# Patient Record
Sex: Female | Born: 1995 | Race: Black or African American | Hispanic: No | Marital: Single | Smoking: Never smoker
Health system: Southern US, Community
[De-identification: ages and names within clinical notes are randomized; demographics above are authoritative.]

## PROBLEM LIST (undated history)

## (undated) DIAGNOSIS — L509 Urticaria, unspecified: Secondary | ICD-10-CM

## (undated) HISTORY — PX: NO PAST SURGERIES: SHX2092

## (undated) HISTORY — DX: Urticaria, unspecified: L50.9

---
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
## 2017-08-14 ENCOUNTER — Other Ambulatory Visit (HOSPITAL_COMMUNITY): Payer: Self-pay | Admitting: Family

## 2017-08-15 ENCOUNTER — Encounter: Payer: Self-pay | Admitting: Advanced Practice Midwife

## 2017-08-15 ENCOUNTER — Ambulatory Visit (INDEPENDENT_AMBULATORY_CARE_PROVIDER_SITE_OTHER): Payer: Managed Care, Other (non HMO) | Admitting: Advanced Practice Midwife

## 2017-08-15 VITALS — BP 136/75 | HR 79 | Wt 171.1 lb

## 2017-08-15 DIAGNOSIS — Z113 Encounter for screening for infections with a predominantly sexual mode of transmission: Secondary | ICD-10-CM

## 2017-08-15 DIAGNOSIS — Z3009 Encounter for other general counseling and advice on contraception: Secondary | ICD-10-CM

## 2017-08-15 DIAGNOSIS — Z01419 Encounter for gynecological examination (general) (routine) without abnormal findings: Secondary | ICD-10-CM | POA: Diagnosis not present

## 2017-08-15 MED ORDER — DESOGESTREL-ETHINYL ESTRADIOL 0.15-0.02/0.01 MG (21/5) PO TABS
1.0000 | ORAL_TABLET | Freq: Every day | ORAL | 11 refills | Status: DC
Start: 2017-08-15 — End: 2018-07-19

## 2017-08-15 NOTE — Progress Notes (Signed)
New pt to this office who desires continuation of OCP's - needs new Rx. .Marland Kitchen

## 2017-08-15 NOTE — Progress Notes (Signed)
Subjective:     Patient ID: Stacie Mccoy, female   DOB: 02/04/1996, 22 y.o.   MRN: [id number]  Stacie Mccoy is a 22 y.o. G0P0000 who presents today for OCP rx refill. She has been getting Kariva from her pediatrician, but is here to establish care with us. She has been on Kariva x 3 years without any problems. She would like to continue this birth control at this time.    Gynecologic Exam  The patient's pertinent negatives include no pelvic pain or vaginal bleeding. This is a new problem. The current episode started more than 1 year ago. The patient is experiencing no pain. She is not pregnant. The vaginal discharge was normal. There has been no bleeding. She is sexually active. She uses condoms for contraception. Her menstrual history has been regular (LMP 07/19/16 ).     Review of Systems  Genitourinary: Negative for pelvic pain.  All other systems reviewed and are negative.      Objective:   Physical Exam  Constitutional: She is oriented to person, place, and time. She appears well-developed and well-nourished. No distress.  HENT:  Head: Normocephalic.  Cardiovascular: Normal rate.  Pulmonary/Chest: Effort normal.  Abdominal: Soft. There is no tenderness.  Neurological: She is alert and oriented to person, place, and time.  Skin: Skin is warm and dry.  Psychiatric: She has a normal mood and affect.  Nursing note and vitals reviewed.      Assessment:     1. Encounter for well woman exam   2. Birth control counseling        Plan:     Pap next year at 4821 GC/CT from urine HIV/RPR today  RX kariva #1 with 11 RF

## 2017-08-15 NOTE — Patient Instructions (Signed)
Oral Contraception Information Oral contraceptive pills (OCPs) are medicines taken to prevent pregnancy. OCPs work by preventing the ovaries from releasing eggs. The hormones in OCPs also cause the cervical mucus to thicken, preventing the sperm from entering the uterus. The hormones also cause the uterine lining to become thin, not allowing a fertilized egg to attach to the inside of the uterus. OCPs are highly effective when taken exactly as prescribed. However, OCPs do not prevent sexually transmitted diseases (STDs). Safe sex practices, such as using condoms along with the pill, can help prevent STDs. Before taking the pill, you may have a physical exam and Pap test. Your health care provider may order blood tests. The health care provider will make sure you are a good candidate for oral contraception. Discuss with your health care provider the possible side effects of the OCP you may be prescribed. When starting an OCP, it can take 2 to 3 months for the body to adjust to the changes in hormone levels in your body. Types of oral contraception  The combination pill-This pill contains estrogen and progestin (synthetic progesterone) hormones. The combination pill comes in 21-day, 28-day, or 91-day packs. Some types of combination pills are meant to be taken continuously (365-day pills). With 21-day packs, you do not take pills for 7 days after the last pill. With 28-day packs, the pill is taken every day. The last 7 pills are without hormones. Certain types of pills have more than 21 hormone-containing pills. With 91-day packs, the first 84 pills contain both hormones, and the last 7 pills contain no hormones or contain estrogen only.  The minipill-This pill contains the progesterone hormone only. The pill is taken every day continuously. It is very important to take the pill at the same time each day. The minipill comes in packs of 28 pills. All 28 pills contain the hormone. Advantages of oral  contraceptive pills  Decreases premenstrual symptoms.  Treats menstrual period cramps.  Regulates the menstrual cycle.  Decreases a heavy menstrual flow.  May treatacne, depending on the type of pill.  Treats abnormal uterine bleeding.  Treats polycystic ovarian syndrome.  Treats endometriosis.  Can be used as emergency contraception. Things that can make oral contraceptive pills less effective OCPs can be less effective if:  You forget to take the pill at the same time every day.  You have a stomach or intestinal disease that lessens the absorption of the pill.  You take OCPs with other medicines that make OCPs less effective, such as antibiotics, certain HIV medicines, and some seizure medicines.  You take expired OCPs.  You forget to restart the pill on day 7, when using the packs of 21 pills.  Risks associated with oral contraceptive pills Oral contraceptive pills can sometimes cause side effects, such as:  Headache.  Nausea.  Breast tenderness.  Irregular bleeding or spotting.  Combination pills are also associated with a small increased risk of:  Blood clots.  Heart attack.  Stroke.  This information is not intended to replace advice given to you by your health care provider. Make sure you discuss any questions you have with your health care provider. Document Released: 08/07/2002 Document Revised: 10/23/2015 Document Reviewed: 11/05/2012 Elsevier Interactive Patient Education  2018 [employer]  

## 2017-08-16 LAB — HIV ANTIBODY (ROUTINE TESTING W REFLEX): HIV Screen 4th Generation wRfx: NONREACTIVE

## 2017-08-16 LAB — RPR: RPR Ser Ql: NONREACTIVE

## 2017-08-16 LAB — GC/CHLAMYDIA PROBE AMP (~~LOC~~) NOT AT ARMC
Chlamydia: NEGATIVE
Neisseria Gonorrhea: NEGATIVE

## 2018-01-02 ENCOUNTER — Encounter: Payer: Self-pay | Admitting: Family

## 2018-01-02 ENCOUNTER — Other Ambulatory Visit (INDEPENDENT_AMBULATORY_CARE_PROVIDER_SITE_OTHER): Payer: Managed Care, Other (non HMO)

## 2018-01-02 ENCOUNTER — Other Ambulatory Visit: Payer: Self-pay | Admitting: Family

## 2018-01-02 ENCOUNTER — Ambulatory Visit (INDEPENDENT_AMBULATORY_CARE_PROVIDER_SITE_OTHER): Payer: Managed Care, Other (non HMO) | Admitting: Family

## 2018-01-02 VITALS — BP 110/80 | HR 53 | Temp 98.7°F | Ht 60.0 in | Wt 174.1 lb

## 2018-01-02 DIAGNOSIS — R5383 Other fatigue: Secondary | ICD-10-CM

## 2018-01-02 DIAGNOSIS — L509 Urticaria, unspecified: Secondary | ICD-10-CM | POA: Diagnosis not present

## 2018-01-02 LAB — CBC WITH DIFFERENTIAL/PLATELET
BASOS PCT: 0.5 % (ref 0.0–3.0)
Basophils Absolute: 0 10*3/uL (ref 0.0–0.1)
Eosinophils Absolute: 0.2 10*3/uL (ref 0.0–0.7)
Eosinophils Relative: 2.6 % (ref 0.0–5.0)
HCT: 39.6 % (ref 36.0–46.0)
Hemoglobin: 13.1 g/dL (ref 12.0–15.0)
LYMPHS ABS: 2.5 10*3/uL (ref 0.7–4.0)
Lymphocytes Relative: 39.2 % (ref 12.0–46.0)
MCHC: 33.1 g/dL (ref 30.0–36.0)
MCV: 83.1 fl (ref 78.0–100.0)
MONOS PCT: 10.6 % (ref 3.0–12.0)
Monocytes Absolute: 0.7 10*3/uL (ref 0.1–1.0)
Neutro Abs: 3 10*3/uL (ref 1.4–7.7)
Neutrophils Relative %: 47.1 % (ref 43.0–77.0)
Platelets: 281 10*3/uL (ref 150.0–400.0)
RBC: 4.77 Mil/uL (ref 3.87–5.11)
RDW: 13.8 % (ref 11.5–15.5)
WBC: 6.3 10*3/uL (ref 4.0–10.5)

## 2018-01-02 LAB — COMPREHENSIVE METABOLIC PANEL
ALK PHOS: 46 U/L (ref 39–117)
ALT: 21 U/L (ref 0–35)
AST: 13 U/L (ref 0–37)
Albumin: 3.6 g/dL (ref 3.5–5.2)
BUN: 10 mg/dL (ref 6–23)
CHLORIDE: 107 meq/L (ref 96–112)
CO2: 25 mEq/L (ref 19–32)
Calcium: 9.2 mg/dL (ref 8.4–10.5)
Creatinine, Ser: 0.93 mg/dL (ref 0.40–1.20)
GFR: 97.64 mL/min (ref >60.00)
Glucose, Bld: 98 mg/dL (ref 70–99)
POTASSIUM: 5 meq/L (ref 3.5–5.1)
SODIUM: 140 meq/L (ref 135–145)
TOTAL PROTEIN: 6.8 g/dL (ref 6.0–8.3)
Total Bilirubin: 0.2 mg/dL (ref 0.2–1.2)

## 2018-01-02 LAB — VITAMIN D 25 HYDROXY (VIT D DEFICIENCY, FRACTURES): VITD: <7 ng/mL — ABNORMAL LOW (ref 30.00–100.00)

## 2018-01-02 LAB — TSH: TSH: 1.88 u[IU]/mL (ref 0.35–4.50)

## 2018-01-02 MED ORDER — TRIAMCINOLONE ACETONIDE 0.1 % EX CREA
1.0000 | TOPICAL_CREAM | Freq: Two times a day (BID) | CUTANEOUS | 0 refills | Status: DC
Start: 2018-01-02 — End: 2021-11-08

## 2018-01-02 MED ORDER — VITAMIN D (ERGOCALCIFEROL) 1.25 MG (50000 UNIT) PO CAPS: 50000.0000 [IU] | ORAL_CAPSULE | ORAL | 0 refills | Status: DC

## 2018-01-02 MED ORDER — CETIRIZINE HCL 10 MG PO TABS
10.0000 mg | ORAL_TABLET | Freq: Every day | ORAL | 3 refills | Status: DC
Start: 2018-01-02 — End: 2018-02-16

## 2018-01-02 NOTE — Patient Instructions (Signed)
Hives Hives (urticaria) are itchy, red, swollen areas on your skin. Hives can show up on any part of your body, and they can vary in size. They can be as small as the tip of a pen or much larger. Hives often fade within 24 hours (acute hives). In other cases, new hives show up after old ones fade. This can continue for many days or weeks (chronic hives). Hives are caused by your body's reaction to an irritant or to something that you are allergic to (trigger). You can get hives right after being around a trigger or hours later. Hives do not spread from person to person (are not contagious). Hives may get worse if you scratch them, if you exercise, or if you have worries (emotional stress). Follow these instructions at home: Medicines  Take or apply over-the-counter and prescription medicines only as told by your doctor.  If you were prescribed an antibiotic medicine, use it as told by your doctor. Do not stop taking the antibiotic even if you start to feel better. Skin Care  Apply cool, wet cloths (cool compresses) to the itchy, red, swollen areas.  Do not scratch your skin. Do not rub your skin. General instructions  Do not take hot showers or baths. This can make itching worse.  Do not wear tight clothes.  Use sunscreen and wear clothing that covers your skin when you are outside.  Avoid any triggers that cause your hives. Keep a journal to help you keep track of what causes your hives. Write down: ? What medicines you take. ? What you eat and drink. ? What products you use on your skin.  Keep all follow-up visits as told by your doctor. This is important. Contact a doctor if:  Your symptoms are not better with medicine.  Your joints are painful or swollen. Get help right away if:  You have a fever.  You have belly pain.  Your tongue or lips are swollen.  Your eyelids are swollen.  Your chest or throat feels tight.  You have trouble breathing or swallowing. These  symptoms may be an emergency. Do not wait to see if the symptoms will go away. Get medical help right away. Call your local emergency services (911 in the [place] Do not drive yourself to the hospital. This information is not intended to replace advice given to you by your health care provider. Make sure you discuss any questions you have with your health care provider. Document Released: 02/24/2008 Document Revised: 10/23/2015 Document Reviewed: 03/05/2015 Elsevier Interactive Patient Education  2018 [employer]  

## 2018-01-02 NOTE — Progress Notes (Signed)
Stacie Mccoy is a 22 y.o. female with the following history as recorded in EpicCare:  There are no active problems to display for this patient.   Current Outpatient Medications  Medication Sig Dispense Refill  . desogestrel-ethinyl estradiol (KARIVA) 0.15-0.02/0.01 MG (21/5) tablet Take 1 tablet by mouth daily. 1 Package 11  . cetirizine (ZYRTEC) 10 MG tablet Take 1 tablet (10 mg total) by mouth daily. 30 tablet 3  . triamcinolone cream (KENALOG) 0.1 % Apply 1 application topically 2 (two) times daily. 30 g 0   No current facility-administered medications for this visit.     Allergies: Patient has no known allergies.  History reviewed. No pertinent past medical history.  History reviewed. No pertinent surgical history.  Family History  Problem Relation Age of Onset  . Gout Father   . Hypertension Mother   . Asthma Brother     Social History   Tobacco Use  . Smoking status: Never Smoker  . Smokeless tobacco: Never Used  Substance Use Topics  . Alcohol use: No    Subjective:  Patient presents today as a new patient; history of increased itching/ "hives" x 3 months; notes that anywhere she scratches her skin, she breaks out in hives; denies any new soaps, foods, detergents or medications. No prior history of eczema/ asthma; not currently taking any type of antihistamine; not able to determine any specific trigger.  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    Objective:  Vitals:   01/02/18 0853  BP: 110/80  Pulse: (!) 53  Temp: 98.7 F (37.1 C)  TempSrc: Oral  SpO2: 99%  Weight: 174 lb 1.9 oz (79 kg)  Height: 5' (1.524 m)    General: Well developed, well nourished, in no acute distress  Skin : Warm and dry.  Head: Normocephalic and atraumatic  Lungs: Respirations unlabored; clear to auscultation bilaterally without wheeze, rales, rhonchi  CVS exam:  normal rate and regular rhythm.  Musculoskeletal: No deformities; no active joint inflammation  Extremities: No edema, cyanosis, clubbing  Vessels: Symmetric bilaterally  Neurologic: Alert and oriented; speech intact; face symmetrical; moves all extremities well; CNII-XII intact without focal deficit   Assessment:  1. Other fatigue   2. Urticaria     Plan:  1. Will update labs today; 2. ? Source; patient in agreement that she would like to see an allergist- referral updated; ? Food allergy; in the interim, trial of Zyrtec 10 mg daily; follow-up as needed after seeing allergist.   No follow-ups on file.  Orders Placed This Encounter  Procedures  . CBC w/Diff    Standing Status:   Future    Number of Occurrences:   1    Standing Expiration Date:   01/02/2019  . Comp Met (CMET)    Standing Status:   Future    Number of Occurrences:   1    Standing Expiration Date:   01/02/2019  . TSH    Standing Status:   Future    Number of Occurrences:   1    Standing Expiration Date:   01/02/2019  . Vitamin D (25 hydroxy)    Standing Status:   Future    Number of Occurrences:   1    Standing Expiration Date:   01/02/2019  . Ambulatory referral to Allergy    Referral Priority:   Routine    Referral Type:  Allergy Testing    Referral Reason:   Specialty Services Required    Requested Specialty:   Allergy    Number of Visits Requested:   1    Requested Prescriptions   Signed Prescriptions Disp Refills  . cetirizine (ZYRTEC) 10 MG tablet 30 tablet 3    Sig: Take 1 tablet (10 mg total) by mouth daily.  Marland Kitchen triamcinolone cream (KENALOG) 0.1 % 30 g 0    Sig: Apply 1 application topically 2 (two) times daily.

## 2018-02-16 ENCOUNTER — Encounter: Payer: Self-pay | Admitting: Allergy & Immunology

## 2018-02-16 ENCOUNTER — Ambulatory Visit (INDEPENDENT_AMBULATORY_CARE_PROVIDER_SITE_OTHER): Payer: Managed Care, Other (non HMO) | Admitting: Allergy & Immunology

## 2018-02-16 VITALS — BP 104/72 | HR 84 | Temp 99.4°F | Resp 18 | Ht 60.0 in | Wt 173.0 lb

## 2018-02-16 DIAGNOSIS — L508 Other urticaria: Secondary | ICD-10-CM | POA: Diagnosis not present

## 2018-02-16 MED ORDER — CETIRIZINE HCL 10 MG PO TABS: ORAL_TABLET | ORAL | 3 refills | Status: DC

## 2018-02-16 NOTE — Progress Notes (Signed)
NEW PATIENT  Date of Service/Encounter:  02/16/18  Referring provider: Marrian Salvage, FNP   Assessment:   Chronic urticaria - with sensitizations to to grasses, outdoor molds, dust mites and cat  Plan/Recommendations:   1. Chronic urticaria - Your history does not have any "red flags" such as fevers, joint pains, or permanent skin changes that would be concerning for a more serious cause of hives.  - Testing today showed: grasses, outdoor molds, dust mites and cat - Avoidance measures provided. - We will get some labs to rule out serious causes of hives: complete blood count, tryptase level, CMP, ESR, and CRP. - Chronic hives are often times a self limited process and will "burn themselves out" over 6-12 months, although this is not always the case.  - In the meantime, start suppressive dosing of antihistamines:   - Morning: Zyrtec (cetirizine) 10-22m (one to two tablets)  - Evening: Zyrtec (cetirizine) 10-235m(one to two tablets) - You can change this dosing at home, decreasing the dose as needed or increasing the dosing as needed.  - If you are not tolerating the medications or are tired of taking them every day, we can start treatment with a monthly injectable medication called Xolair.   2. Return in about 3 months (around 05/18/2018).  Subjective:   Stacie Mccoy is a [age].o. female presenting today for evaluation of  Chief Complaint  Patient presents with  . Urticaria    ?pressure induced? states that she breaks out whenever she scratches her skin. started about a year ago. worsened over the summer (May)    [place] a history of the following: There are no active problems to display for this patient.   History obtained from: chart review and patient.  [place] referred by MuMarrian SalvageFNEdmundson    Stacie Mccoy is a [age].o. female presenting for an evaluation of itchiness and hives. She reports that this has been going on for about 1  year but has become worse in April/May. She will become itchy in various parts of her body, legs, arms, back, feet, between fingers, etc, and when she scratches the area she will develop a rash. She describes the rash as raised, red areas that start off as separate areas and will come together and form a larger rash. She reports they will last about 30 minutes to an hour. She has tried hydrocortisone cream and benadryl. She reports that the cream did not help but that the benadryl helped with the itchiness a Demuro bit. She denies any other symptoms that occur with it, including SOB, wheezing, coughing, throat tightness, sneezing, runny nose, or other symptoms. She is not sure of any triggers. She denies any new hobbies, change in diet, change in medications, change in laundry detergent or soap. She denies and smoking, EtOH or drug use. 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 She lives in a dorm room and has not had any changes in her housing situation. She is a [employer] [place] school [place] in May 2020.   She denies any seasonal allergies. She has never had allergy testing done before. She denies any history of childhood asthma or skin disorders.  Otherwise, there is no history of other atopic diseases, including asthma, food allergies, drug allergies, environmental allergies, stinging insect allergies or  eczema. There is no significant infectious history. Vaccinations are up to date.    Past Medical History: There are no active problems to display for this patient.   Medication List:  Allergies as of 02/16/2018   No Known Allergies     Medication List        Accurate as of 02/16/18  3:48 PM. Always use your most recent med list.          cetirizine 10 MG tablet Commonly known as:  ZYRTEC Take one to two tablets twice a day as needed.   desogestrel-ethinyl estradiol  0.15-0.02/0.01 MG (21/5) tablet Commonly known as:  KARIVA,AZURETTE,MIRCETTE Take 1 tablet by mouth daily.   triamcinolone cream 0.1 % Commonly known as:  KENALOG Apply 1 application topically 2 (two) times daily.       Birth History: born at term without complications  Developmental History: [place] has met all milestones on time. She has required no speech therapy, occupational therapy and physical therapy.   Past Surgical History: Past Surgical History:  Procedure Laterality Date  . NO PAST SURGERIES       Family History: Family History  Problem Relation Age of Onset  . Gout Father   . Hypertension Mother   . Asthma Brother    Brother has asthma, and allergies to multiple things. Denies any family history of rheumatoid disorders including Lupus, RA, or Sjorgens syndrome.   Social History: [name] lives at home with her parents during the summer and a dorm during the school year. There is wood flooring throughout the home. They have gas heating and central cooling. There is a dog in the home. There are no dust mite coverings on the bedding. There is no tobacco exposure. 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      Review of Systems: a 14-point review of systems is pertinent for what is mentioned in HPI.  Otherwise, all other systems were negative. Constitutional: negative other than that listed in the HPI Eyes: negative other than that listed in the HPI Ears, nose, mouth, throat, and face: negative other than that listed in the HPI Respiratory: negative other than that listed in the HPI Cardiovascular: negative other than that listed in the HPI Gastrointestinal: negative other than that listed in the HPI Genitourinary: negative other than that listed in the HPI Integument: negative other than that listed in the HPI Hematologic: negative other than that listed in the HPI Musculoskeletal: negative other than that listed  in the HPI Neurological: negative other than that listed in the HPI Allergy/Immunologic: negative other than that listed in the HPI    Objective:   Blood pressure 104/72, pulse 84, temperature 99.4 F (37.4 C), temperature source Oral, resp. rate 18, height 5' (1.524 m), weight 173 lb (78.5 kg), SpO2 99 %. Body mass index is 33.79 kg/m.   Physical Exam:  General: Alert, interactive, in no acute distress. Pleasant talkative female.  Eyes: No conjunctival injection bilaterally, no discharge on the right, no discharge on the left and no Horner-Trantas dots present. PERRL bilaterally. EOMI without pain. No photophobia.  Ears: Right TM pearly gray with normal light reflex, Left TM pearly gray with normal light reflex, Right TM intact without perforation and Left TM intact without perforation.  Nose/Throat: External nose within normal limits and septum midline. Turbinates edematous and pale with clear discharge. Posterior oropharynx mildly erythematous without cobblestoning in the posterior oropharynx. Tonsils 2+ without  exudates.  Tongue without thrush. Neck: Supple without thyromegaly. Trachea midline. Adenopathy: no enlarged lymph nodes appreciated in the anterior cervical, occipital, axillary, epitrochlear, inguinal, or popliteal regions. Lungs: Clear to auscultation without wheezing, rhonchi or rales. No increased work of breathing. CV: Normal S1/S2. No murmurs. Capillary refill <2 seconds.  Abdomen: Nondistended, nontender. No guarding or rebound tenderness. Bowel sounds present in all fields and hyperactive  Skin: Warm and dry, without lesions or rashes. Extremities:  No clubbing, cyanosis or edema. Neuro:   Grossly intact. No focal deficits appreciated. Responsive to questions.  Diagnostic studies:   Allergy Studies:   Indoor/Outdoor Percutaneous Adult Environmental Panel: positive to timothy grass, Drechslera, Df mite, Dp mites and cat. Otherwise negative with adequate  controls.     Salvatore Marvel, MD [facility]

## 2018-02-16 NOTE — Patient Instructions (Addendum)
1. Chronic urticaria - Your history does not have any "red flags" such as fevers, joint pains, or permanent skin changes that would be concerning for a more serious cause of hives.  - Testing today showed: grasses, outdoor molds, dust mites and cat - Avoidance measures provided. - We will get some labs to rule out serious causes of hives: complete blood count, tryptase level, CMP, ESR, and CRP. - Chronic hives are often times a self limited process and will "burn themselves out" over 6-12 months, although this is not always the case.  - In the meantime, start suppressive dosing of antihistamines:   - Morning: Zyrtec (cetirizine) 10-98m (one to two tablets)  - Evening: Zyrtec (cetirizine) 10-252m(one to two tablets) - You can change this dosing at home, decreasing the dose as needed or increasing the dosing as needed.  - If you are not tolerating the medications or are tired of taking them every day, we can start treatment with a monthly injectable medication called Xolair.   2. Return in about 3 months (around 05/18/2018).   Please inform usKoreaf any Emergency Department visits, hospitalizations, or changes in symptoms. Call usKoreaefore going to the ED for breathing or allergy symptoms since we might be able to fit you in for a sick visit. Feel free to contact usKoreanytime with any questions, problems, or concerns.  It was a pleasure to meet you and your family today!  Websites that have reliable patient information: 1. American Academy of Asthma, Allergy, and Immunology: www.aaaai.org 2. Food Allergy Research and Education (FARE): foodallergy.org 3. Mothers of Asthmatics: http://www.asthmacommunitynetwork.org 4. American College of Allergy, Asthma, and Immunology: wwMonthlyElectricBill.co.uk Make sure you are registered to vote! If you have moved or changed any of your contact information, you will need to get this updated before voting!    Reducing Pollen Exposure  The American Academy of Allergy,  Asthma and Immunology suggests the following steps to reduce your exposure to pollen during allergy seasons.    1. Do not hang sheets or clothing out to dry; pollen may collect on these items. 2. Do not mow lawns or spend time around freshly cut grass; mowing stirs up pollen. 3. Keep windows closed at night.  Keep car windows closed while driving. 4. Minimize morning activities outdoors, a time when pollen counts are usually at their highest. 5. Stay indoors as much as possible when pollen counts or humidity is high and on windy days when pollen tends to remain in the air longer. 6. Use air conditioning when possible.  Many air conditioners have filters that trap the pollen spores. 7. Use a HEPA room air filter to remove pollen form the indoor air you breathe.  Control of Mold Allergen   Mold and fungi can grow on a variety of surfaces provided certain temperature and moisture conditions exist.  Outdoor molds grow on plants, decaying vegetation and soil.  The major outdoor mold, Alternaria and Cladosporium, are found in very high numbers during hot and dry conditions.  Generally, a late Summer - Fall peak is seen for common outdoor fungal spores.  Rain will temporarily lower outdoor mold spore count, but counts rise rapidly when the rainy period ends.  The most important indoor molds are Aspergillus and Penicillium.  Dark, humid and poorly ventilated basements are ideal sites for mold growth.  The next most common sites of mold growth are the bathroom and the kitchen.  Outdoor (Seasonal) Mold Control  Positive outdoor molds via  skin testing: Drechslera (Curvalaria)  1. Use air conditioning and keep windows closed 2. Avoid exposure to decaying vegetation. 3. Avoid leaf raking. 4. Avoid grain handling. 5. Consider wearing a face mask if working in moldy areas.     Control of House Dust Mite Allergen    House dust mites play a major role in allergic asthma and rhinitis.  They occur in  environments with high humidity wherever human skin, the food for dust mites is found. High levels have been detected in dust obtained from mattresses, pillows, carpets, upholstered furniture, bed covers, clothes and soft toys.  The principal allergen of the house dust mite is found in its feces.  A gram of dust may contain 1,000 mites and 250,000 fecal particles.  Mite antigen is easily measured in the air during house cleaning activities.    1. Encase mattresses, including the box spring, and pillow, in an air tight cover.  Seal the zipper end of the encased mattresses with wide adhesive tape. 2. Wash the bedding in water of 130 degrees Farenheit weekly.  Avoid cotton comforters/quilts and flannel bedding: the most ideal bed covering is the dacron comforter. 3. Remove all upholstered furniture from the bedroom. 4. Remove carpets, carpet padding, rugs, and non-washable window drapes from the bedroom.  Wash drapes weekly or use plastic window coverings. 5. Remove all non-washable stuffed toys from the bedroom.  Wash stuffed toys weekly. 6. Have the room cleaned frequently with a vacuum cleaner and a damp dust-mop.  The patient should not be in a room which is being cleaned and should wait 1 hour after cleaning before going into the room. 7. Close and seal all heating outlets in the bedroom.  Otherwise, the room will become filled with dust-laden air.  An electric heater can be used to heat the room. 8. Reduce indoor humidity to less than 50%.  Do not use a humidifier.  Control of Dog or Cat Allergen  Avoidance is the best way to manage a dog or cat allergy. If you have a dog or cat and are allergic to dog or cats, consider removing the dog or cat from the home. If you have a dog or cat but don't want to find it a new home, or if your family wants a pet even though someone in the household is allergic, here are some strategies that may help keep symptoms at bay:  1. Keep the pet out of your bedroom  and restrict it to only a few rooms. Be advised that keeping the dog or cat in only one room will not limit the allergens to that room. 2. Don't pet, hug or kiss the dog or cat; if you do, wash your hands with soap and water. 3. High-efficiency particulate air (HEPA) cleaners run continuously in a bedroom or living room can reduce allergen levels over time. 4. Regular use of a high-efficiency vacuum cleaner or a central vacuum can reduce allergen levels. 5. Giving your dog or cat a bath at least once a week can reduce airborne allergen.

## 2018-02-21 LAB — TRYPTASE: Tryptase: 5.7 ug/L (ref 2.2–13.2)

## 2018-02-21 LAB — CBC WITH DIFFERENTIAL/PLATELET
Basophils Absolute: 0 10*3/uL (ref 0.0–0.2)
Basos: 1 %
EOS (ABSOLUTE): 0.1 10*3/uL (ref 0.0–0.4)
Eos: 1 %
Hematocrit: 40.2 % (ref 34.0–46.6)
Hemoglobin: 13.2 g/dL (ref 11.1–15.9)
Immature Grans (Abs): 0 10*3/uL (ref 0.0–0.1)
Immature Granulocytes: 0 %
LYMPHS ABS: 1.8 10*3/uL (ref 0.7–3.1)
Lymphs: 28 %
MCH: 27.3 pg (ref 26.6–33.0)
MCHC: 32.8 g/dL (ref 31.5–35.7)
MCV: 83 fL (ref 79–97)
Monocytes Absolute: 0.7 10*3/uL (ref 0.1–0.9)
Monocytes: 10 %
NEUTROS ABS: 3.8 10*3/uL (ref 1.4–7.0)
Neutrophils: 60 %
Platelets: 290 10*3/uL (ref 150–450)
RBC: 4.84 x10E6/uL (ref 3.77–5.28)
RDW: 12.3 % (ref 12.3–15.4)
WBC: 6.4 10*3/uL (ref 3.4–10.8)

## 2018-02-21 LAB — CMP14+EGFR
A/G RATIO: 1.3 (ref 1.2–2.2)
ALK PHOS: 54 IU/L (ref 39–117)
ALT: 21 IU/L (ref 0–32)
AST: 16 IU/L (ref 0–40)
Albumin: 3.9 g/dL (ref 3.5–5.5)
BILIRUBIN TOTAL: 0.2 mg/dL (ref 0.0–1.2)
BUN/Creatinine Ratio: 14 (ref 9–23)
BUN: 11 mg/dL (ref 6–20)
CO2: 21 mmol/L (ref 20–29)
Calcium: 9.1 mg/dL (ref 8.7–10.2)
Chloride: 102 mmol/L (ref 96–106)
Creatinine, Ser: 0.76 mg/dL (ref 0.57–1.00)
GFR calc Af Amer: 130 mL/min/{1.73_m2} (ref >59)
GFR calc non Af Amer: 112 mL/min/{1.73_m2} (ref >59)
GLUCOSE: 67 mg/dL (ref 65–99)
Globulin, Total: 3 g/dL (ref 1.5–4.5)
POTASSIUM: 4 mmol/L (ref 3.5–5.2)
Sodium: 139 mmol/L (ref 134–144)
Total Protein: 6.9 g/dL (ref 6.0–8.5)

## 2018-02-21 LAB — SEDIMENTATION RATE: Sed Rate: 22 mm/hr (ref 0–32)

## 2018-02-21 LAB — C-REACTIVE PROTEIN: CRP: 6 mg/L (ref 0–10)

## 2018-02-21 LAB — ALPHA-GAL PANEL
Alpha Gal IgE*: <0.1 kU/L (ref <0.10)
BEEF CLASS INTERPRETATION: 0
Class Interpretation: 0
Lamb/Mutton (Ovis spp) IgE: <0.1 kU/L (ref <0.35)
PORK CLASS INTERPRETATION: 0

## 2018-04-07 ENCOUNTER — Ambulatory Visit: Payer: Managed Care, Other (non HMO) | Admitting: Family

## 2018-04-07 DIAGNOSIS — Z0289 Encounter for other administrative examinations: Secondary | ICD-10-CM

## 2018-05-18 ENCOUNTER — Ambulatory Visit: Payer: Managed Care, Other (non HMO) | Admitting: Allergy

## 2018-05-18 DIAGNOSIS — J309 Allergic rhinitis, unspecified: Secondary | ICD-10-CM

## 2018-07-17 ENCOUNTER — Other Ambulatory Visit: Payer: Self-pay | Admitting: Advanced Practice Midwife

## 2019-08-01 ENCOUNTER — Other Ambulatory Visit: Payer: Self-pay | Admitting: Advanced Practice Midwife

## 2019-08-02 ENCOUNTER — Telehealth: Payer: Self-pay | Admitting: *Deleted

## 2019-08-02 MED ORDER — DESOGESTREL-ETHINYL ESTRADIOL 0.15-0.02/0.01 MG (21/5) PO TABS: 1.0000 | ORAL_TABLET | Freq: Every day | ORAL | 0 refills | Status: DC

## 2019-08-02 NOTE — Telephone Encounter (Addendum)
VM message left by pt's mother requesting refill of Stacie Mccoy's birth control. She has picked up her last remaining refill. Please call back.   3/4  1400 Called pt and informed her that we can provide 1 refill only of her OCP's (good for 3 months). She has not been seen in office for 2 years and now requires Annual Gyn exam. Pt advised to call back to schedule office appt within the next 2 months. She will be given additional refills at the time of her visit. Pt voiced understanding, agreed to plan of care and had no further questions.

## 2019-09-26 ENCOUNTER — Encounter: Payer: Self-pay | Admitting: Advanced Practice Midwife

## 2019-09-26 ENCOUNTER — Other Ambulatory Visit: Payer: Self-pay

## 2019-09-26 ENCOUNTER — Ambulatory Visit (INDEPENDENT_AMBULATORY_CARE_PROVIDER_SITE_OTHER): Payer: BLUE CROSS/BLUE SHIELD | Admitting: Advanced Practice Midwife

## 2019-09-26 ENCOUNTER — Other Ambulatory Visit (HOSPITAL_COMMUNITY)
Admission: RE | Admit: 2019-09-26 | Discharge: 2019-09-26 | Disposition: A | Discharge status: Home / Self Care | Payer: BLUE CROSS/BLUE SHIELD | Source: Ambulatory Visit | Attending: Advanced Practice Midwife | Admitting: Advanced Practice Midwife

## 2019-09-26 VITALS — BP 131/77 | HR 75 | Wt 171.0 lb

## 2019-09-26 DIAGNOSIS — Z01419 Encounter for gynecological examination (general) (routine) without abnormal findings: Secondary | ICD-10-CM | POA: Insufficient documentation

## 2019-09-26 MED ORDER — DESOGESTREL-ETHINYL ESTRADIOL 0.15-0.02/0.01 MG (21/5) PO TABS: 1.0000 | ORAL_TABLET | Freq: Every day | ORAL | 4 refills | Status: AC

## 2019-09-26 NOTE — Progress Notes (Signed)
GYNECOLOGY ANNUAL PREVENTATIVE CARE ENCOUNTER NOTE  Subjective:   Stacie Mccoy is a 24 y.o. G0P0000 female here for a routine annual gynecologic exam.  Current complaints: none.   Denies abnormal vaginal bleeding, discharge, pelvic pain, problems with intercourse or other gynecologic concerns.    Gynecologic History LMP 08/28/2019 approx.  Contraception: OCP (estrogen/progesterone) Last Pap: none. Results were: NA Last mammogram: Na, age . Results were: NA  Obstetric History OB History  Gravida Para Term Preterm AB Living  0 0 0 0 0 0  SAB TAB Ectopic Multiple Live Births  0 0 0 0 0    Past Medical History:  Diagnosis Date  . Urticaria     Past Surgical History:  Procedure Laterality Date  . NO PAST SURGERIES      Current Outpatient Medications on File Prior to Visit  Medication Sig Dispense Refill  . desogestrel-ethinyl estradiol (KARIVA) 0.15-0.02/0.01 MG (21/5) tablet Take 1 tablet by mouth daily. 84 tablet 0  . cetirizine (ZYRTEC) 10 MG tablet Take one to two tablets twice a day as needed. (Patient not taking: Reported on 09/26/2019) 120 tablet 3  . triamcinolone cream (KENALOG) 0.1 % Apply 1 application topically 2 (two) times daily. (Patient not taking: Reported on 09/26/2019) 30 g 0   No current facility-administered medications on file prior to visit.    No Known Allergies  Social History   Socioeconomic History  . Marital status: Single    Spouse name: Not on file  . Number of children: Not on file  . Years of education: Not on file  . Highest education level: Not on file  Occupational History  . Not on file  Tobacco Use  . Smoking status: Never Smoker  . Smokeless tobacco: Never Used  Substance and Sexual Activity  . Alcohol use: No  . Drug use: No  . Sexual activity: Yes    Birth control/protection: OCP  Other Topics Concern  . Not on file  Social History Narrative  . Not on file   Social Determinants of Health   Financial Resource Strain:    . Difficulty of Paying Living Expenses:   Food Insecurity:   . Worried About Charity fundraiser in the Last Year:   . Arboriculturist in the Last Year:   Transportation Needs:   . Film/video editor (Medical):   Marland Kitchen Lack of Transportation (Non-Medical):   Physical Activity:   . Days of Exercise per Week:   . Minutes of Exercise per Session:   Stress:   . Feeling of Stress :   Social Connections:   . Frequency of Communication with Friends and Family:   . Frequency of Social Gatherings with Friends and Family:   . Attends Religious Services:   . Active Member of Clubs or Organizations:   . Attends Archivist Meetings:   Marland Kitchen Marital Status:   Intimate Partner Violence:   . Fear of Current or Ex-Partner:   . Emotionally Abused:   Marland Kitchen Physically Abused:   . Sexually Abused:     Family History  Problem Relation Age of Onset  . Gout Father   . Hypertension Mother   . Asthma Brother     The following portions of the patient's history were reviewed and updated as appropriate: allergies, current medications, past family history, past medical history, past social history, past surgical history and problem list.  Review of Systems Pertinent items noted in HPI and remainder of comprehensive ROS otherwise negative.  Objective:  BP 131/77   Pulse 75   Wt 171 lb (77.6 kg)   BMI 33.40 kg/m  CONSTITUTIONAL: Well-developed, well-nourished female in no acute distress.  HENT:  Normocephalic, atraumatic, External right and left ear normal. Oropharynx is clear and moist EYES: Conjunctivae and EOM are normal. Pupils are equal, round, and reactive to light. No scleral icterus.  NECK: Normal range of motion, supple, no masses.  Normal thyroid.  SKIN: Skin is warm and dry. No rash noted. Not diaphoretic. No erythema. No pallor. NEUROLOGIC: Alert and oriented to person, place, and time. Normal reflexes, muscle tone coordination. No cranial nerve deficit noted. PSYCHIATRIC: Normal  mood and affect. Normal behavior. Normal judgment and thought content. CARDIOVASCULAR: Normal heart rate noted, regular rhythm RESPIRATORY: Clear to auscultation bilaterally. Effort and breath sounds normal, no problems with respiration noted. BREASTS: Symmetric in size. No masses, skin changes, nipple drainage, or lymphadenopathy. ABDOMEN: Soft, normal bowel sounds, no distention noted.  No tenderness, rebound or guarding.  PELVIC: Normal appearing external genitalia; normal appearing vaginal mucosa and cervix.  No abnormal discharge noted.  Pap smear obtained.  Normal uterine size, no other palpable masses, no uterine or adnexal tenderness. MUSCULOSKELETAL: Normal range of motion. No tenderness.  No cyanosis, clubbing, or edema.  2+ distal pulses.   Assessment and Plan:  1. Women's annual routine gynecological examination - Cytology - PAP( [facility]) - RX Kariva #3 packs with 3 refills   Will follow up results of pap smear and manage accordingly. Routine preventative health maintenance measures emphasized. Please refer to After Visit Summary for other counseling recommendations.    Thressa Sheller DNP, CNM  09/26/19  1:40 PM

## 2019-09-27 LAB — CYTOLOGY - PAP
Chlamydia: POSITIVE — AB
Comment: NEGATIVE
Comment: NEGATIVE
Comment: NORMAL
Diagnosis: NEGATIVE
High risk HPV: NEGATIVE
Neisseria Gonorrhea: NEGATIVE

## 2019-10-04 ENCOUNTER — Telehealth: Payer: Self-pay | Admitting: Lactation Services

## 2019-10-04 DIAGNOSIS — A749 Chlamydial infection, unspecified: Secondary | ICD-10-CM

## 2019-10-04 MED ORDER — DOXYCYCLINE HYCLATE 50 MG PO CAPS: 100.0000 mg | ORAL_CAPSULE | Freq: Two times a day (BID) | ORAL | 0 refills | Status: AC

## 2019-10-04 NOTE — Telephone Encounter (Signed)
-----   Message from Armando Reichert, CNM sent at 10/04/2019 12:51 PM EDT ----- +chlamydia. Please call patient and treat.

## 2019-10-04 NOTE — Telephone Encounter (Signed)
Patient called and informed that she has positive for Chlamydia.   She was informed that Doxycycline to be sent to Pharmacy and she should take entire dosage. Informed her to have partner call PCP or [facility] for treatment and abstain from sexual intercourse for 14 days after both partners are treated.   Patient informed that she needs to have follow up appt in the office in 3 weeeks for TOC. Patient voiced understanding to all the above.   STD report form faxed to [facility].

## 2019-10-25 ENCOUNTER — Ambulatory Visit: Payer: Medicaid Other

## 2020-04-08 ENCOUNTER — Encounter: Payer: Self-pay | Admitting: *Deleted

## 2020-10-12 ENCOUNTER — Other Ambulatory Visit: Payer: Self-pay | Admitting: Advanced Practice Midwife

## 2021-11-08 ENCOUNTER — Ambulatory Visit
Admission: EM | Admit: 2021-11-08 | Discharge: 2021-11-08 | Disposition: A | Discharge status: Home / Self Care | Payer: Managed Care, Other (non HMO) | Attending: Emergency Medicine | Admitting: Emergency Medicine

## 2021-11-08 DIAGNOSIS — J039 Acute tonsillitis, unspecified: Secondary | ICD-10-CM | POA: Insufficient documentation

## 2021-11-08 DIAGNOSIS — J358 Other chronic diseases of tonsils and adenoids: Secondary | ICD-10-CM

## 2021-11-08 DIAGNOSIS — J36 Peritonsillar abscess: Secondary | ICD-10-CM | POA: Diagnosis present

## 2021-11-08 LAB — POCT RAPID STREP A (OFFICE): Rapid Strep A Screen: NEGATIVE

## 2021-11-08 MED ORDER — CEFDINIR 300 MG PO CAPS: 300.0000 mg | ORAL_CAPSULE | Freq: Two times a day (BID) | ORAL | 0 refills | Status: AC

## 2021-11-08 NOTE — ED Provider Notes (Signed)
[facility]    CSN: [id number] Arrival date & time: 11/08/21  1046    HISTORY   Chief Complaint  Patient presents with   Sore Throat   HPI Stacie Mccoy is a 26 y.o. female. Patient presents to urgent care complaining of a 3-day history of sore throat.  Patient states initially she did not think much about it, thought she might of just caught a virus.  Patient states this morning, her throat felt significantly worse so she decided to look at her tonsils which she noticed were significantly enlarged with white patches all over them.  Patient states the left tonsil is greater than the right and she also has noticed increased swelling on her upper neck below her jaw on her left side as well.  Patient denies fever, headache, nausea, vomiting, diarrhea, known sick contacts, exposure to STD.  Patient reports significant pain with swallowing but denies cough and hoarseness.  The history is provided by the patient.   Past Medical History:  Diagnosis Date   Urticaria    There are no problems to display for this patient.  Past Surgical History:  Procedure Laterality Date   NO PAST SURGERIES     OB History     Gravida  0   Para  0   Term  0   Preterm  0   AB  0   Living  0      SAB  0   IAB  0   Ectopic  0   Multiple  0   Live Births  0          Home Medications    Prior to Admission medications   Medication Sig Start Date End Date Taking? Authorizing Provider  cefdinir (OMNICEF) 300 MG capsule Take 1 capsule (300 mg total) by mouth 2 (two) times daily for 10 days. 11/08/21 11/18/21 Yes Theadora Rama Scales, PA-C  desogestrel-ethinyl estradiol (KARIVA) 0.15-0.02/0.01 MG (21/5) tablet Take 1 tablet by mouth daily. 09/26/19   Armando Reichert, CNM   Family History Family History  Problem Relation Age of Onset   Gout Father    Hypertension Mother    Asthma Brother    Social History Social History   Tobacco Use   Smoking status: Never    Smokeless tobacco: Never  Vaping Use   Vaping Use: Never used  Substance Use Topics   Alcohol use: No   Drug use: No   Allergies   Patient has no known allergies.  Review of Systems Review of Systems Pertinent findings noted in history of present illness.   Physical Exam Triage Vital Signs ED Triage Vitals  Enc Vitals Group     BP 03/27/21 0827 (!) 147/82     Pulse Rate 03/27/21 0827 72     Resp 03/27/21 0827 18     Temp 03/27/21 0827 98.3 F (36.8 C)     Temp Source 03/27/21 0827 Oral     SpO2 03/27/21 0827 98 %     Weight --      Height --      Head Circumference --      Peak Flow --      Pain Score 03/27/21 0826 5     Pain Loc --      Pain Edu? --      Excl. in GC? --   No data found.  Updated Vital Signs BP 112/60 (BP Location: Left Arm)   Pulse (!) 106  Temp 98.3 F (36.8 C) (Oral)   Resp 16   SpO2 98%   Physical Exam Vitals and nursing note reviewed.  Constitutional:      General: She is not in acute distress.    Appearance: She is well-developed. She is ill-appearing. She is not toxic-appearing.  HENT:     Head: Normocephalic and atraumatic.     Salivary Glands: Right salivary gland is diffusely enlarged. Right salivary gland is not tender. Left salivary gland is diffusely enlarged and tender.     Right Ear: Hearing and external ear normal.     Left Ear: Hearing and external ear normal.     Ears:     Comments: Bilateral EACs with mild erythema, bilateral TMs are normal    Nose: No mucosal edema, congestion or rhinorrhea.     Right Turbinates: Not enlarged, swollen or pale.     Left Turbinates: Not enlarged or swollen.     Right Sinus: No maxillary sinus tenderness or frontal sinus tenderness.     Left Sinus: No maxillary sinus tenderness or frontal sinus tenderness.     Mouth/Throat:     Lips: Pink. No lesions.     Mouth: Mucous membranes are moist. No oral lesions or angioedema.     Dentition: No gingival swelling.     Tongue: No lesions.      Palate: No mass.     Pharynx: Uvula midline. Pharyngeal swelling, oropharyngeal exudate, posterior oropharyngeal erythema and uvula swelling present.     Tonsils: Tonsillar exudate present. 2+ on the right. 4+ on the left.  Eyes:     Extraocular Movements: Extraocular movements intact.     Conjunctiva/sclera: Conjunctivae normal.     Pupils: Pupils are equal, round, and reactive to light.  Neck:     Thyroid: No thyroid mass, thyromegaly or thyroid tenderness.     Trachea: Tracheal tenderness present. No abnormal tracheal secretions or tracheal deviation.     Comments: Voice is muffled Cardiovascular:     Rate and Rhythm: Normal rate and regular rhythm.     Pulses: Normal pulses.     Heart sounds: Normal heart sounds, S1 normal and S2 normal. No murmur heard.    No friction rub. No gallop.  Pulmonary:     Effort: Pulmonary effort is normal. No accessory muscle usage, prolonged expiration, respiratory distress or retractions.     Breath sounds: No stridor, decreased air movement or transmitted upper airway sounds. No decreased breath sounds, wheezing, rhonchi or rales.  Abdominal:     General: Bowel sounds are normal.     Palpations: Abdomen is soft.     Tenderness: There is generalized abdominal tenderness. There is no right CVA tenderness, left CVA tenderness or rebound. Negative signs include Murphy's sign.     Hernia: No hernia is present.  Musculoskeletal:        General: No tenderness. Normal range of motion.     Cervical back: Full passive range of motion without pain, normal range of motion and neck supple.     Right lower leg: No edema.     Left lower leg: No edema.  Lymphadenopathy:     Cervical: Cervical adenopathy present.     Right cervical: Superficial cervical adenopathy and posterior cervical adenopathy present.     Left cervical: Superficial cervical adenopathy and posterior cervical adenopathy present.  Skin:    General: Skin is warm and dry.     Findings: No  erythema, lesion or rash.  Neurological:  General: No focal deficit present.     Mental Status: She is alert and oriented to person, place, and time. Mental status is at baseline.  Psychiatric:        Mood and Affect: Mood normal.        Behavior: Behavior normal.        Thought Content: Thought content normal.        Judgment: Judgment normal.     Visual Acuity Right Eye Distance:   Left Eye Distance:   Bilateral Distance:    Right Eye Near:   Left Eye Near:    Bilateral Near:     UC Couse / Diagnostics / Procedures:    EKG  Radiology No results found.  Procedures Procedures (including critical care time)  UC Diagnoses / Final Clinical Impressions(s)   I have reviewed the triage vital signs and the nursing notes.  Pertinent labs & imaging results that were available during my care of the patient were reviewed by me and considered in my medical decision making (see chart for details).   Final diagnoses:  Acute tonsillitis, unspecified etiology  Tonsillar exudate  Abscess of tonsil   Patient advised to begin cefdinir for abscess of left tonsil and likely bacterial tonsillitis as well.  Rapid strep test was negative, throat culture pending.  Patient advised to finish full 10-day course despite results of throat culture due to presence of abscess of left tonsil.  Return precautions advised.  Ibuprofen recommended for pain..  ED Prescriptions     Medication Sig Dispense Auth. Provider   cefdinir (OMNICEF) 300 MG capsule Take 1 capsule (300 mg total) by mouth 2 (two) times daily for 10 days. 20 capsule Theadora Rama Scales, PA-C      PDMP not reviewed this encounter.  Pending results:  Labs Reviewed  CULTURE, GROUP A STREP [facility])  POCT RAPID STREP A (OFFICE)    Medications Ordered in UC: Medications - No data to display  Disposition Upon Discharge:  Condition: stable for discharge home Home: take medications as prescribed; routine discharge instructions  as discussed; follow up as advised.  Patient presented with an acute illness with associated systemic symptoms and significant discomfort requiring urgent management. In my opinion, this is a condition that a prudent lay person (someone who possesses an average knowledge of health and medicine) may potentially expect to result in complications if not addressed urgently such as respiratory distress, impairment of bodily function or dysfunction of bodily organs.   Routine symptom specific, illness specific and/or disease specific instructions were discussed with the patient and/or caregiver at length.   As such, the patient has been evaluated and assessed, work-up was performed and treatment was provided in alignment with urgent care protocols and evidence based medicine.  Patient/parent/caregiver has been advised that the patient may require follow up for further testing and treatment if the symptoms continue in spite of treatment, as clinically indicated and appropriate.  If the patient was tested for COVID-19, Influenza and/or RSV, then the patient/parent/guardian was advised to isolate at home pending the results of his/her diagnostic coronavirus test and potentially longer if they're positive. I have also advised pt that if his/her COVID-19 test returns positive, it's recommended to self-isolate for at least 10 days after symptoms first appeared AND until fever-free for 24 hours without fever reducer AND other symptoms have improved or resolved. Discussed self-isolation recommendations as well as instructions for household member/close contacts as per the CDC and [place] DHHS, and also gave patient the  COVID packet with this information.  Patient/parent/caregiver has been [facility] in 3-5 days if no better; to PCP or the Emergency Department if new signs and symptoms develop, or if the current signs or symptoms continue to change or worsen for further workup, evaluation and  treatment as clinically indicated and appropriate  The patient will follow up with their current PCP if and as advised. If the patient does not currently have a PCP we will assist them in obtaining one.   The patient may need specialty follow up if the symptoms continue, in spite of conservative treatment and management, for further workup, evaluation, consultation and treatment as clinically indicated and appropriate.  Patient/parent/caregiver verbalized understanding and agreement of plan as discussed.  All questions were addressed during visit.  Please see discharge instructions below for further details of plan.  Discharge Instructions:   Discharge Instructions      Your strep test today is negative.  Streptococcal throat culture will be performed per our protocol.  The result of your throat culture will be posted to your MyChart once it is complete, this typically takes 3 to 5 days.     Based on my physical exam findings and the history you provided to me today, I recommend that you begin antibiotics now for presumed strep throat instead of waiting for the strep culture result.  I have sent a prescription to your pharmacy.  After 24 hours of antibiotics, you should begin to feel significantly better.     After 24 hours of taking antibiotics, please discard your toothbrush as well as any other oral devices that you are currently using and replace them with new ones to avoid reinfection.   Once you have been on antibiotics for a full 24 hours, you are no longer considered contagious.   I have provided you with a note to return to work.   Please see the list below for recommended medications, dosages and frequencies to provide relief of your current symptoms:    Omnicef (cefdinir):  1 capsule twice daily for 10 days, you can take it with or without food.  This antibiotic can cause upset stomach, this will resolve once antibiotics are complete.  You are welcome to use a probiotic, eat  yogurt, take Imodium while taking this medication.  Please avoid other systemic medications such as Maalox, Pepto-Bismol or milk of magnesia as they can interfere with your body's ability to absorb the antibiotics.   Advil, Motrin (ibuprofen): This is a good anti-inflammatory medication which addresses aches, pains and inflammation of the upper airways that causes sinus and nasal congestion as well as in the lower airways which makes your cough feel tight and sometimes burn.  I recommend that you take between 400 to 600 mg every 6-8 hours as needed.      It is very important that you take antibiotics as prescribed.  If you skip doses or do not complete the full course of antibiotics, you put yourself at significant risk of recurrent infection which can often be worse than your initial infection.   Please follow-up within the next 5-7 days either with your primary care provider or urgent care if your symptoms do not resolve.  If you do not have a primary care provider, we will assist you in finding one.   Thank you for visiting urgent care today.  We appreciate the opportunity to participate in your care.     This office note has been  dictated using Teaching laboratory technician.  Unfortunately, and despite my best efforts, this method of dictation can sometimes lead to occasional typographical or grammatical errors.  I apologize in advance if this occurs.     Theadora Rama Scales, PA-C 11/08/21 1201

## 2021-11-08 NOTE — Discharge Instructions (Signed)
Your strep test today is negative.  Streptococcal throat culture will be performed per our protocol.  The result of your throat culture will be posted to your MyChart once it is complete, this typically takes 3 to 5 days.     Based on my physical exam findings and the history you provided to me today, I recommend that you begin antibiotics now for presumed strep throat instead of waiting for the strep culture result.  I have sent a prescription to your pharmacy.  After 24 hours of antibiotics, you should begin to feel significantly better.     After 24 hours of taking antibiotics, please discard your toothbrush as well as any other oral devices that you are currently using and replace them with new ones to avoid reinfection.   Once you have been on antibiotics for a full 24 hours, you are no longer considered contagious.   I have provided you with a note to return to work.   Please see the list below for recommended medications, dosages and frequencies to provide relief of your current symptoms:    Omnicef (cefdinir):  1 capsule twice daily for 10 days, you can take it with or without food.  This antibiotic can cause upset stomach, this will resolve once antibiotics are complete.  You are welcome to use a probiotic, eat yogurt, take Imodium while taking this medication.  Please avoid other systemic medications such as Maalox, Pepto-Bismol or milk of magnesia as they can interfere with your body's ability to absorb the antibiotics.   Advil, Motrin (ibuprofen): This is a good anti-inflammatory medication which addresses aches, pains and inflammation of the upper airways that causes sinus and nasal congestion as well as in the lower airways which makes your cough feel tight and sometimes burn.  I recommend that you take between 400 to 600 mg every 6-8 hours as needed.      It is very important that you take antibiotics as prescribed.  If you skip doses or do not complete the full course of antibiotics,  you put yourself at significant risk of recurrent infection which can often be worse than your initial infection.   Please follow-up within the next 5-7 days either with your primary care provider or urgent care if your symptoms do not resolve.  If you do not have a primary care provider, we will assist you in finding one.   Thank you for visiting urgent care today.  We appreciate the opportunity to participate in your care.

## 2021-11-09 LAB — CULTURE, GROUP A STREP (THRC)

## 2023-07-04 ENCOUNTER — Ambulatory Visit
Admission: EM | Admit: 2023-07-04 | Discharge: 2023-07-04 | Disposition: A | Discharge status: Home / Self Care | Payer: Medicaid Other | Attending: Internal Medicine | Admitting: Internal Medicine

## 2023-07-04 DIAGNOSIS — J020 Streptococcal pharyngitis: Secondary | ICD-10-CM

## 2023-07-04 LAB — POCT RAPID STREP A (OFFICE): Rapid Strep A Screen: POSITIVE — AB

## 2023-07-04 MED ORDER — PREDNISONE 20 MG PO TABS: 40.0000 mg | ORAL_TABLET | Freq: Every day | ORAL | 0 refills | Status: AC

## 2023-07-04 MED ORDER — AMOXICILLIN 875 MG PO TABS: 875.0000 mg | ORAL_TABLET | Freq: Two times a day (BID) | ORAL | 0 refills | Status: DC

## 2023-07-04 NOTE — ED Triage Notes (Signed)
Pt presents with sore throat. Pt had "white spots show up Wednesday". She states it feels like she is swallowing glass. Mainly left side.

## 2023-07-04 NOTE — Discharge Instructions (Addendum)
Strep is positive. This is a bacterial infection that requires antibiotics. This can be spread so use good hand hygiene and avoid prolonged exposure to others for the next 2-3 days. We will treat with the following:  Amoxicillin 875 mg twice daily for 7 days. Take with food. This is an antibiotic Prednisone 40 mg (2 tablets) daily for 5 days. Take this in the morning. This is a steroid to help with inflammation, pain and swelling. Rest and stay hydrated Return to urgent care or PCP if symptoms worsen or fail to resolve.

## 2023-07-04 NOTE — ED Provider Notes (Signed)
[facility]    CSN: [id number] Arrival date & time: 07/04/23  0932      History   Chief Complaint Chief Complaint  Patient presents with   Sore Throat    HPI Stacie Mccoy is a 28 y.o. female.   28 year old female who presents to urgent care with complaints of very painful throat with fevers, chills and bodyaches.  She reports that this started on Wednesday and has progressively gotten worse.  She says that it feels like she is swallowing glass.  She looked at the back of her throat and noticed Doreene Forrey spots.  She has had tonsillitis in the past and has had strep in the past but not frequently.  She denies any sick exposures that she knows of.   Sore Throat Pertinent negatives include no chest pain, no abdominal pain and no shortness of breath.    Past Medical History:  Diagnosis Date   Urticaria     There are no active problems to display for this patient.   Past Surgical History:  Procedure Laterality Date   NO PAST SURGERIES      OB History     Gravida  0   Para  0   Term  0   Preterm  0   AB  0   Living  0      SAB  0   IAB  0   Ectopic  0   Multiple  0   Live Births  0            Home Medications    Prior to Admission medications   Medication Sig Start Date End Date Taking? Authorizing Provider  amoxicillin (AMOXIL) 875 MG tablet Take 1 tablet (875 mg total) by mouth 2 (two) times daily for 7 days. 07/04/23 07/11/23 Yes Shay Bartoli A, PA-C  predniSONE (DELTASONE) 20 MG tablet Take 2 tablets (40 mg total) by mouth daily with breakfast for 5 days. 07/04/23 07/09/23 Yes Cosima Prentiss, Austin Miles, PA-C  desogestrel-ethinyl estradiol (KARIVA) 0.15-0.02/0.01 MG (21/5) tablet Take 1 tablet by mouth daily. 09/26/19   Armando Reichert, CNM    Family History Family History  Problem Relation Age of Onset   Gout Father    Hypertension Mother    Asthma Brother     Social History Social History   Tobacco Use   Smoking status: Never    Smokeless tobacco: Never  Vaping Use   Vaping status: Never Used  Substance Use Topics   Alcohol use: No   Drug use: No     Allergies   Patient has no known allergies.   Review of Systems Review of Systems  Constitutional:  Negative for chills and fever.  HENT:  Negative for ear pain and sore throat.   Eyes:  Negative for pain and visual disturbance.  Respiratory:  Negative for cough and shortness of breath.   Cardiovascular:  Negative for chest pain and palpitations.  Gastrointestinal:  Negative for abdominal pain and vomiting.  Genitourinary:  Negative for dysuria and hematuria.  Musculoskeletal:  Negative for arthralgias and back pain.  Skin:  Negative for color change and rash.  Neurological:  Negative for seizures and syncope.  All other systems reviewed and are negative.    Physical Exam Triage Vital Signs ED Triage Vitals  Encounter Vitals Group     BP 07/04/23 1257 (!) 147/94     Systolic BP Percentile --      Diastolic BP Percentile --  Pulse Rate 07/04/23 1257 (!) 110     Resp 07/04/23 1257 18     Temp 07/04/23 1257 98.4 F (36.9 C)     Temp Source 07/04/23 1257 Oral     SpO2 07/04/23 1257 96 %     Weight --      Height --      Head Circumference --      Peak Flow --      Pain Score 07/04/23 1255 8     Pain Loc --      Pain Education --      Exclude from Growth Chart --    No data found.  Updated Vital Signs BP (!) 147/94 (BP Location: Left Arm)   Pulse (!) 110   Temp 98.4 F (36.9 C) (Oral)   Resp 18   LMP 06/20/2023 (Approximate)   SpO2 96%   Visual Acuity Right Eye Distance:   Left Eye Distance:   Bilateral Distance:    Right Eye Near:   Left Eye Near:    Bilateral Near:     Physical Exam Vitals and nursing note reviewed.  Constitutional:      General: She is not in acute distress.    Appearance: She is well-developed.  HENT:     Head: Normocephalic and atraumatic.     Right Ear: Tympanic membrane normal.     Left Ear:  Tympanic membrane normal.     Nose: No congestion.     Mouth/Throat:     Mouth: Mucous membranes are moist.     Pharynx: Pharyngeal swelling, oropharyngeal exudate and posterior oropharyngeal erythema present.     Tonsils: Tonsillar exudate present. 2+ on the right. 2+ on the left.  Eyes:     Conjunctiva/sclera: Conjunctivae normal.  Cardiovascular:     Rate and Rhythm: Normal rate and regular rhythm.     Heart sounds: No murmur heard. Pulmonary:     Effort: Pulmonary effort is normal. No respiratory distress.     Breath sounds: Normal breath sounds.  Abdominal:     Palpations: Abdomen is soft.     Tenderness: There is no abdominal tenderness.  Musculoskeletal:        General: No swelling.     Cervical back: Neck supple.  Skin:    General: Skin is warm and dry.     Capillary Refill: Capillary refill takes less than 2 seconds.  Neurological:     Mental Status: She is alert.  Psychiatric:        Mood and Affect: Mood normal.      UC Treatments / Results  Labs (all labs ordered are listed, but only abnormal results are displayed) Labs Reviewed  POCT RAPID STREP A (OFFICE) - Abnormal; Notable for the following components:      Result Value   Rapid Strep A Screen Positive (*)    All other components within normal limits    EKG   Radiology No results found.  Procedures Procedures (including critical care time)  Medications Ordered in UC Medications - No data to display  Initial Impression / Assessment and Plan / UC Course  I have reviewed the triage vital signs and the nursing notes.  Pertinent labs & imaging results that were available during my care of the patient were reviewed by me and considered in my medical decision making (see chart for details).     Streptococcal sore throat   Strep is positive. This is a bacterial infection that requires antibiotics. This can  be spread so use good hand hygiene and avoid prolonged exposure to others for the next 2-3  days. We will treat with the following:  Amoxicillin 875 mg twice daily for 7 days. Take with food. This is an antibiotic Prednisone 40 mg (2 tablets) daily for 5 days. Take this in the morning. This is a steroid to help with inflammation, pain and swelling. Rest and stay hydrated Return to urgent care or PCP if symptoms worsen or fail to resolve.    Final Clinical Impressions(s) / UC Diagnoses   Final diagnoses:  Streptococcal sore throat     Discharge Instructions      Strep is positive. This is a bacterial infection that requires antibiotics. This can be spread so use good hand hygiene and avoid prolonged exposure to others for the next 2-3 days. We will treat with the following:  Amoxicillin 875 mg twice daily for 7 days. Take with food. This is an antibiotic Prednisone 40 mg (2 tablets) daily for 5 days. Take this in the morning. This is a steroid to help with inflammation, pain and swelling. Rest and stay hydrated Return to urgent care or PCP if symptoms worsen or fail to resolve.     ED Prescriptions     Medication Sig Dispense Auth. Provider   amoxicillin (AMOXIL) 875 MG tablet Take 1 tablet (875 mg total) by mouth 2 (two) times daily for 7 days. 14 tablet Lynard Postlewait A, PA-C   predniSONE (DELTASONE) 20 MG tablet Take 2 tablets (40 mg total) by mouth daily with breakfast for 5 days. 10 tablet Landis Martins, [place]      PDMP not reviewed this encounter.   Landis Martins, [place] 07/04/23 1311

## 2023-07-06 ENCOUNTER — Ambulatory Visit
Admission: EM | Admit: 2023-07-06 | Discharge: 2023-07-06 | Disposition: A | Discharge status: Home / Self Care | Payer: Medicaid Other | Attending: Physician Assistant | Admitting: Physician Assistant

## 2023-07-06 DIAGNOSIS — J020 Streptococcal pharyngitis: Secondary | ICD-10-CM | POA: Diagnosis not present

## 2023-07-06 MED ORDER — CEFTRIAXONE SODIUM 1 G IJ SOLR
1.0000 g | Freq: Once | INTRAMUSCULAR | Status: AC
  Administered 2023-07-06: 1 g via INTRAMUSCULAR

## 2023-07-06 NOTE — ED Provider Notes (Signed)
 [facility]    CSN: [id number] Arrival date & time: 07/06/23  0844      History   Chief Complaint Chief Complaint  Patient presents with   Follow-up    HPI Stacie Mccoy is a 28 y.o. female.   Patient here today for evaluation of sore throat that has worsened since Monday.  She was seen and evaluated and has been treated for strep since then and notes she has taken her amoxicillin  and prednisone  but feels symptoms are worsening.  The history is provided by the patient.    Past Medical History:  Diagnosis Date   Urticaria     There are no active problems to display for this patient.   Past Surgical History:  Procedure Laterality Date   NO PAST SURGERIES      OB History     Gravida  0   Para  0   Term  0   Preterm  0   AB  0   Living  0      SAB  0   IAB  0   Ectopic  0   Multiple  0   Live Births  0            Home Medications    Prior to Admission medications   Medication Sig Start Date End Date Taking? Authorizing Provider  amoxicillin  (AMOXIL ) 875 MG tablet Take 1 tablet (875 mg total) by mouth 2 (two) times daily for 7 days. 07/04/23 07/11/23 Yes White, Elizabeth A, PA-C  predniSONE  (DELTASONE ) 20 MG tablet Take 2 tablets (40 mg total) by mouth daily with breakfast for 5 days. 07/04/23 07/09/23 Yes White, Elizabeth A, PA-C  desogestrel -ethinyl estradiol  (KARIVA ) 0.15-0.02/0.01 MG (21/5) tablet Take 1 tablet by mouth daily. 09/26/19   Edmundo Powell BIRCH, CNM    Family History Family History  Problem Relation Age of Onset   Gout Father    Hypertension Mother    Asthma Brother     Social History Social History   Tobacco Use   Smoking status: Never   Smokeless tobacco: Never  Vaping Use   Vaping status: Never Used  Substance Use Topics   Alcohol use: No   Drug use: No     Allergies   Patient has no known allergies.   Review of Systems Review of Systems  Constitutional:  Negative for chills and fever.  HENT:   Positive for congestion and sore throat. Negative for ear pain.   Eyes:  Negative for discharge and redness.  Respiratory:  Negative for cough, shortness of breath and wheezing.   Gastrointestinal:  Negative for abdominal pain, diarrhea, nausea and vomiting.     Physical Exam Triage Vital Signs ED Triage Vitals  Encounter Vitals Group     BP 07/06/23 0903 106/83     Systolic BP Percentile --      Diastolic BP Percentile --      Pulse Rate 07/06/23 0903 (!) 106     Resp 07/06/23 0903 18     Temp 07/06/23 0903 98.4 F (36.9 C)     Temp Source 07/06/23 0903 Oral     SpO2 07/06/23 0903 97 %     Weight 07/06/23 0900 190 lb (86.2 kg)     Height 07/06/23 0900 5' (1.524 m)     Head Circumference --      Peak Flow --      Pain Score 07/06/23 0900 10     Pain Loc --  Pain Education --      Exclude from Growth Chart --    No data found.  Updated Vital Signs BP 106/83 (BP Location: Left Arm)   Pulse (!) 106   Temp 98.4 F (36.9 C) (Oral)   Resp 18   Ht 5' (1.524 m)   Wt 190 lb (86.2 kg)   LMP 06/20/2023 (Approximate)   SpO2 97%   BMI 37.11 kg/m   Visual Acuity Right Eye Distance:   Left Eye Distance:   Bilateral Distance:    Right Eye Near:   Left Eye Near:    Bilateral Near:     Physical Exam Vitals and nursing note reviewed.  Constitutional:      General: She is not in acute distress.    Appearance: Normal appearance. She is not ill-appearing.  HENT:     Head: Normocephalic and atraumatic.     Nose: Congestion present.     Mouth/Throat:     Mouth: Mucous membranes are moist.     Pharynx: Oropharyngeal exudate and posterior oropharyngeal erythema present.  Eyes:     Conjunctiva/sclera: Conjunctivae normal.  Cardiovascular:     Rate and Rhythm: Normal rate.  Pulmonary:     Effort: Pulmonary effort is normal. No respiratory distress.  Skin:    General: Skin is warm and dry.  Neurological:     Mental Status: She is alert.  Psychiatric:        Mood and  Affect: Mood normal.        Thought Content: Thought content normal.      UC Treatments / Results  Labs (all labs ordered are listed, but only abnormal results are displayed) Labs Reviewed - No data to display  EKG   Radiology No results found.  Procedures Procedures (including critical care time)  Medications Ordered in UC Medications  cefTRIAXone  (ROCEPHIN ) injection 1 g (has no administration in time range)    Initial Impression / Assessment and Plan / UC Course  I have reviewed the triage vital signs and the nursing notes.  Pertinent labs & imaging results that were available during my care of the patient were reviewed by me and considered in my medical decision making (see chart for details).    Will treat with Rocephin  injection in office.  Advise she continue oral antibiotics and steroids.  Recommended further evaluation in the emergency room if no improvement by the end of the day or sooner with any worsening as we cannot rule out peritonsillar abscess in office.  Patient expressed understanding  Final Clinical Impressions(s) / UC Diagnoses   Final diagnoses:  Strep pharyngitis     Discharge Instructions       Please report to ED if no improvement or with any worsening.    ED Prescriptions   None    PDMP not reviewed this encounter.   Billy Asberry FALCON, PA-C 07/06/23 [phone]

## 2023-07-06 NOTE — Discharge Instructions (Signed)
  Please report to ED if no improvement or with any worsening.

## 2023-07-06 NOTE — ED Triage Notes (Signed)
"  I was here on Monday for sore throat and + for strep and given Amoxicillin and Prednisone, my throat feel's worse so I am not sure if I should be giving it more time". No fever.

## 2023-07-07 ENCOUNTER — Emergency Department (HOSPITAL_COMMUNITY)
Admission: EM | Admit: 2023-07-07 | Discharge: 2023-07-07 | Disposition: A | Discharge status: Home / Self Care | Payer: Medicaid Other | Attending: Emergency Medicine | Admitting: Emergency Medicine

## 2023-07-07 ENCOUNTER — Encounter (HOSPITAL_COMMUNITY): Payer: Self-pay | Admitting: Emergency Medicine

## 2023-07-07 ENCOUNTER — Ambulatory Visit: Payer: Self-pay

## 2023-07-07 DIAGNOSIS — Z20822 Contact with and (suspected) exposure to covid-19: Secondary | ICD-10-CM | POA: Insufficient documentation

## 2023-07-07 DIAGNOSIS — J029 Acute pharyngitis, unspecified: Secondary | ICD-10-CM | POA: Diagnosis present

## 2023-07-07 DIAGNOSIS — J020 Streptococcal pharyngitis: Secondary | ICD-10-CM | POA: Diagnosis not present

## 2023-07-07 LAB — GROUP A STREP BY PCR: Group A Strep by PCR: DETECTED — AB

## 2023-07-07 LAB — RESP PANEL BY RT-PCR (RSV, FLU A&B, COVID)  RVPGX2
Influenza A by PCR: NEGATIVE
Influenza B by PCR: NEGATIVE
Resp Syncytial Virus by PCR: NEGATIVE
SARS Coronavirus 2 by RT PCR: NEGATIVE

## 2023-07-07 MED ORDER — AMOXICILLIN-POT CLAVULANATE 875-125 MG PO TABS: 1.0000 | ORAL_TABLET | Freq: Two times a day (BID) | ORAL | 0 refills | Status: AC

## 2023-07-07 MED ORDER — LIDOCAINE VISCOUS HCL 2 % MT SOLN
15.0000 mL | Freq: Once | OROMUCOSAL | Status: AC
  Administered 2023-07-07: 15 mL via OROMUCOSAL
  Filled 2023-07-07: qty 15

## 2023-07-07 MED ORDER — KETOROLAC TROMETHAMINE 15 MG/ML IJ SOLN
15.0000 mg | Freq: Once | INTRAMUSCULAR | Status: AC
  Administered 2023-07-07: 15 mg via INTRAMUSCULAR
  Filled 2023-07-07: qty 1

## 2023-07-07 MED ORDER — LIDOCAINE VISCOUS HCL 2 % MT SOLN: 15.0000 mL | OROMUCOSAL | 0 refills | Status: AC | PRN

## 2023-07-07 NOTE — ED Provider Notes (Signed)
 [place] EMERGENCY DEPARTMENT AT [facility] Provider Note  CSN: [id number] Arrival date & time: 07/07/23 9086  Chief Complaint(s) Sore Throat  HPI Stacie Mccoy is a 28 y.o. female without significant past medical history presenting to the emergency department sore throat.  Patient reports that she had a strep throat test that was positive 3 days ago.  Has been on amoxicillin , prednisone .  Reports persistent symptoms.  Saw urgent care yesterday, received a dose of ceftriaxone .  They told her to come to the emergency department for evaluation if her symptoms were persistent.  She reports persistent symptoms.  She reports painful swallowing, no difficulty breathing.  Able to tolerate fluids.  Came to the emergency department as her symptoms are not improving.   Past Medical History Past Medical History:  Diagnosis Date   Urticaria    There are no active problems to display for this patient.  Home Medication(s) Prior to Admission medications   Medication Sig Start Date End Date Taking? Authorizing Provider  amoxicillin -clavulanate (AUGMENTIN ) 875-125 MG tablet Take 1 tablet by mouth every 12 (twelve) hours. 07/07/23  Yes Francesca Elsie CROME, MD  lidocaine  (XYLOCAINE ) 2 % solution Use as directed 15 mLs in the mouth or throat every 4 (four) hours as needed for mouth pain. 07/07/23  Yes Francesca Elsie CROME, MD  desogestrel -ethinyl estradiol  (KARIVA ) 0.15-0.02/0.01 MG (21/5) tablet Take 1 tablet by mouth daily. 09/26/19   Edmundo Moats D, CNM  predniSONE  (DELTASONE ) 20 MG tablet Take 2 tablets (40 mg total) by mouth daily with breakfast for 5 days. 07/04/23 07/09/23  Teresa Almarie DELENA DEVONNA                                                                                                                                    Past Surgical History Past Surgical History:  Procedure Laterality Date   NO PAST SURGERIES     Family History Family History  Problem Relation Age of Onset   Gout  Father    Hypertension Mother    Asthma Brother     Social History Social History   Tobacco Use   Smoking status: Never   Smokeless tobacco: Never  Vaping Use   Vaping status: Never Used  Substance Use Topics   Alcohol use: No   Drug use: No   Allergies Patient has no known allergies.  Review of Systems Review of Systems  All other systems reviewed and are negative.   Physical Exam Vital Signs  I have reviewed the triage vital signs BP (!) 140/86 (BP Location: Left Arm)   Pulse 98   Temp 98.5 F (36.9 C) (Oral)   Resp 16   LMP 06/20/2023 (Approximate)   SpO2 100%  Physical Exam Vitals and nursing note reviewed.  Constitutional:      Appearance: Normal appearance.  HENT:     Head: Normocephalic and atraumatic.     Mouth/Throat:  Mouth: Mucous membranes are moist.     Comments: Uvula midline, bilateral tonsillar swelling, 1-2+.  Symmetric.  Exudate on both tonsils.  Posterior pharynx with erythema.  floor of mouth soft.  Normal dentition.  Left anterior cervical lymphadenopathy.  Normal voice. Eyes:     Conjunctiva/sclera: Conjunctivae normal.  Cardiovascular:     Rate and Rhythm: Normal rate.  Pulmonary:     Effort: Pulmonary effort is normal. No respiratory distress.  Abdominal:     General: Abdomen is flat.  Musculoskeletal:        General: No deformity.     Cervical back: Neck supple.  Skin:    General: Skin is warm and dry.     Capillary Refill: Capillary refill takes less than 2 seconds.  Neurological:     General: No focal deficit present.     Mental Status: She is alert. Mental status is at baseline.  Psychiatric:        Mood and Affect: Mood normal.        Behavior: Behavior normal.     ED Results and Treatments Labs (all labs ordered are listed, but only abnormal results are displayed) Labs Reviewed  RESP PANEL BY RT-PCR (RSV, FLU A&B, COVID)  [id number]  GROUP A STREP BY PCR                                                                                                                           Radiology No results found.  Pertinent labs & imaging results that were available during my care of the patient were reviewed by me and considered in my medical decision making (see MDM for details).  Medications Ordered in ED Medications  lidocaine  (XYLOCAINE ) 2 % viscous mouth solution 15 mL (has no administration in time range)  ketorolac  (TORADOL ) 15 MG/ML injection 15 mg (has no administration in time range)                                                                                                                                     Procedures Procedures  (including critical care time)  Medical Decision Making / ED Course   MDM:  28 year old presenting to the emergency department sore throat.  On exam, patient has enlarged tonsils with exudate.  No uvular deviation or other signs of abscess.  Doubt retropharyngeal abscess, peritonsillar abscess, Ludwig's angina or other dangerous causes of  sore throat.  She has had a positive stress test.  She has been on antibiotics and steroids but has persistent symptoms.  Without clinical evidence of abscess at this time, I do not think patient needs further imaging such as a CT scan.  Discussed continuing with her medication regimen.  Advised follow-up with primary physician and discussed return precautions given abscess could still potentially develop.  Given failure to improve on amoxicillin  will prescribe Augmentin . Will discharge patient to home. All questions answered. Patient comfortable with plan of discharge. Return precautions discussed with patient and specified on the after visit summary.       Additional history obtained: -Additional history obtained from ems -External records from outside source obtained and reviewed including: Chart review including previous notes, labs, imaging, consultation notes including prior UC notes        Medicines ordered and  prescription drug management: Meds ordered this encounter  Medications   lidocaine  (XYLOCAINE ) 2 % viscous mouth solution 15 mL   lidocaine  (XYLOCAINE ) 2 % solution    Sig: Use as directed 15 mLs in the mouth or throat every 4 (four) hours as needed for mouth pain.    Dispense:  100 mL    Refill:  0   ketorolac  (TORADOL ) 15 MG/ML injection 15 mg   amoxicillin -clavulanate (AUGMENTIN ) 875-125 MG tablet    Sig: Take 1 tablet by mouth every 12 (twelve) hours.    Dispense:  14 tablet    Refill:  0    -I have reviewed the patients home medicines and have made adjustments as needed  Social Determinants of Health:  Diagnosis or treatment significantly limited by social determinants of health: obesity   Reevaluation: After the interventions noted above, I reevaluated the patient and found that their symptoms have improved  Co morbidities that complicate the patient evaluation  Past Medical History:  Diagnosis Date   Urticaria       Dispostion: Disposition decision including need for hospitalization was considered, and patient discharged from emergency department.    Final Clinical Impression(s) / ED Diagnoses Final diagnoses:  Strep throat     This chart was dictated using voice recognition software.  Despite best efforts to proofread,  errors can occur which can change the documentation meaning.    Francesca Elsie CROME, MD 07/07/23 1056

## 2023-07-07 NOTE — ED Triage Notes (Signed)
 Pt back to the ED with known strept  throat has had 5 days of po antibiotics and rocephin  shot

## 2023-07-07 NOTE — Discharge Instructions (Addendum)
 We evaluated you for your sore throat.  Your symptoms are explained by your positive strep throat test.  We did not see any signs of an abscess on your physical examination.  Please continue to take your steroids and antibiotics.  Sometimes, it just takes longer for the infection to fully resolve.  We have switched you to a stronger antibiotic.  Please take Tylenol and Motrin  for your symptoms at home.  You can take 1000 mg of Tylenol every 6 hours and 600 mg of ibuprofen  every 6 hours as needed for your symptoms.  You can take these medicines together as needed, either at the same time, or alternating every 3 hours.  Although we did not see any signs of an abscess on your examination today, it is possible that you could still develop an abscess.  If you notice any new symptoms such as muffled voice, trouble swallowing even fluids or trouble breathing, increasing or severe pain, please return to the emergency department for reevaluation.
